# Patient Record
Sex: Female | Born: 1985 | Hispanic: Yes | Marital: Married | Smoking: Never smoker
Health system: Southern US, Community
[De-identification: ages and names within clinical notes are randomized; demographics above are authoritative.]

## PROBLEM LIST (undated history)

## (undated) DIAGNOSIS — E119 Type 2 diabetes mellitus without complications: Secondary | ICD-10-CM

---

## 2005-03-29 ENCOUNTER — Emergency Department: Payer: Self-pay | Admitting: Emergency Medicine

## 2014-07-21 ENCOUNTER — Emergency Department: Payer: Self-pay | Admitting: Emergency Medicine

## 2014-07-21 LAB — URINALYSIS, COMPLETE
Bilirubin,UR: NEGATIVE
GLUCOSE, UR: NEGATIVE mg/dL (ref 0–75)
KETONE: NEGATIVE
LEUKOCYTE ESTERASE: NEGATIVE
Nitrite: NEGATIVE
Ph: 6 (ref 4.5–8.0)
Protein: NEGATIVE
RBC,UR: 1 /HPF (ref 0–5)
Specific Gravity: 1.009 (ref 1.003–1.030)
Squamous Epithelial: 3

## 2014-07-21 LAB — CBC WITH DIFFERENTIAL/PLATELET
BASOS PCT: 0.5 %
Basophil #: 0 10*3/uL (ref 0.0–0.1)
Eosinophil #: 0.1 10*3/uL (ref 0.0–0.7)
Eosinophil %: 1.8 %
HCT: 37.7 % (ref 35.0–47.0)
HGB: 12.2 g/dL (ref 12.0–16.0)
Lymphocyte #: 1.6 10*3/uL (ref 1.0–3.6)
Lymphocyte %: 22.5 %
MCH: 27.1 pg (ref 26.0–34.0)
MCHC: 32.4 g/dL (ref 32.0–36.0)
MCV: 84 fL (ref 80–100)
MONO ABS: 0.3 x10 3/mm (ref 0.2–0.9)
Monocyte %: 3.8 %
Neutrophil #: 5.1 10*3/uL (ref 1.4–6.5)
Neutrophil %: 71.4 %
Platelet: 241 10*3/uL (ref 150–440)
RBC: 4.51 10*6/uL (ref 3.80–5.20)
RDW: 13.3 % (ref 11.5–14.5)
WBC: 7.2 10*3/uL (ref 3.6–11.0)

## 2014-07-21 LAB — BASIC METABOLIC PANEL
Anion Gap: 10 (ref 7–16)
BUN: 7 mg/dL (ref 7–18)
Calcium, Total: 8.7 mg/dL (ref 8.5–10.1)
Chloride: 106 mmol/L (ref 98–107)
Co2: 25 mmol/L (ref 21–32)
Creatinine: 0.53 mg/dL — ABNORMAL LOW (ref 0.60–1.30)
EGFR (African American): >60
EGFR (Non-African Amer.): >60
Glucose: 129 mg/dL — ABNORMAL HIGH (ref 65–99)
OSMOLALITY: 281 (ref 275–301)
Potassium: 3.5 mmol/L (ref 3.5–5.1)
Sodium: 141 mmol/L (ref 136–145)

## 2019-03-21 ENCOUNTER — Encounter: Payer: Self-pay | Admitting: Emergency Medicine

## 2019-03-21 ENCOUNTER — Emergency Department
Admission: EM | Admit: 2019-03-21 | Discharge: 2019-03-22 | Disposition: A | Discharge status: Home / Self Care | Payer: BLUE CROSS/BLUE SHIELD | Attending: Emergency Medicine | Admitting: Emergency Medicine

## 2019-03-21 DIAGNOSIS — R631 Polydipsia: Secondary | ICD-10-CM | POA: Diagnosis not present

## 2019-03-21 DIAGNOSIS — E1165 Type 2 diabetes mellitus with hyperglycemia: Secondary | ICD-10-CM | POA: Insufficient documentation

## 2019-03-21 DIAGNOSIS — R5383 Other fatigue: Secondary | ICD-10-CM | POA: Diagnosis not present

## 2019-03-21 DIAGNOSIS — R739 Hyperglycemia, unspecified: Secondary | ICD-10-CM | POA: Diagnosis present

## 2019-03-21 DIAGNOSIS — R42 Dizziness and giddiness: Secondary | ICD-10-CM | POA: Diagnosis not present

## 2019-03-21 DIAGNOSIS — E089 Diabetes mellitus due to underlying condition without complications: Secondary | ICD-10-CM

## 2019-03-21 DIAGNOSIS — R350 Frequency of micturition: Secondary | ICD-10-CM | POA: Diagnosis not present

## 2019-03-21 LAB — BASIC METABOLIC PANEL
Anion gap: 10 (ref 5–15)
BUN: 12 mg/dL (ref 6–20)
CO2: 23 mmol/L (ref 22–32)
Calcium: 9 mg/dL (ref 8.9–10.3)
Chloride: 98 mmol/L (ref 98–111)
Creatinine, Ser: 0.46 mg/dL (ref 0.44–1.00)
GFR calc Af Amer: >60 mL/min (ref >60)
GFR calc non Af Amer: >60 mL/min (ref >60)
Glucose, Bld: 488 mg/dL — ABNORMAL HIGH (ref 70–99)
Potassium: 4 mmol/L (ref 3.5–5.1)
Sodium: 131 mmol/L — ABNORMAL LOW (ref 135–145)

## 2019-03-21 LAB — URINALYSIS, COMPLETE (UACMP) WITH MICROSCOPIC
Bacteria, UA: NONE SEEN
Bilirubin Urine: NEGATIVE
Glucose, UA: >=500 mg/dL — AB
Hgb urine dipstick: NEGATIVE
Ketones, ur: 80 mg/dL — AB
Leukocytes,Ua: NEGATIVE
Nitrite: NEGATIVE
Protein, ur: NEGATIVE mg/dL
Specific Gravity, Urine: 1.03 (ref 1.005–1.030)
pH: 7 (ref 5.0–8.0)

## 2019-03-21 LAB — CBC
HCT: 38.4 % (ref 36.0–46.0)
Hemoglobin: 13.2 g/dL (ref 12.0–15.0)
MCH: 28 pg (ref 26.0–34.0)
MCHC: 34.4 g/dL (ref 30.0–36.0)
MCV: 81.5 fL (ref 80.0–100.0)
Platelets: 249 10*3/uL (ref 150–400)
RBC: 4.71 MIL/uL (ref 3.87–5.11)
RDW: 11.9 % (ref 11.5–15.5)
WBC: 8.1 10*3/uL (ref 4.0–10.5)
nRBC: 0 % (ref 0.0–0.2)

## 2019-03-21 LAB — POCT PREGNANCY, URINE: Preg Test, Ur: NEGATIVE

## 2019-03-21 LAB — GLUCOSE, CAPILLARY: Glucose-Capillary: 446 mg/dL — ABNORMAL HIGH (ref 70–99)

## 2019-03-21 MED ORDER — SODIUM CHLORIDE 0.9 % IV BOLUS
1000.0000 mL | Freq: Once | INTRAVENOUS | Status: AC
  Administered 2019-03-21: 1000 mL via INTRAVENOUS

## 2019-03-21 MED ORDER — METFORMIN HCL 500 MG PO TABS: 500.0000 mg | ORAL_TABLET | Freq: Two times a day (BID) | ORAL | 1 refills | Status: AC

## 2019-03-21 MED ORDER — SODIUM CHLORIDE 0.9 % IV BOLUS
1000.0000 mL | Freq: Once | INTRAVENOUS | Status: AC
  Administered 2019-03-21: 22:00:00 1000 mL via INTRAVENOUS

## 2019-03-21 NOTE — ED Provider Notes (Signed)
[facility] Emergency Department Provider Note    ____________________________________________   I have reviewed the triage vital signs and the nursing notes.   HISTORY  Chief Complaint Hyperglycemia   History limited by: Not Limited   HPI Jordan Maddox is a 33 y.o. female who presents to the emergency department today because of concerns for hyperglycemia.  Patient was found to have hyperglycemia when she went to urgent care today.  She states she went there because over the past couple of weeks she has noticed increased thirst and urination.  She has had associated fatigue.  She is also had some dizziness.  The patient denies any history of diabetes but states her grandparents had diabetes.   Records reviewed. Per medical record review patient has a history of plantar fasciitis  History reviewed. No pertinent past medical history.  There are no active problems to display for this patient.   History reviewed. No pertinent surgical history.  Prior to Admission medications   Not on File    Allergies Patient has no known allergies.  No family history on file.  Social History Social History   Tobacco Use  . Smoking status: Never Smoker  . Smokeless tobacco: Never Used  Substance Use Topics  . Alcohol use: Never    Frequency: Never  . Drug use: Not on file    Review of Systems Constitutional: No fever/chills Eyes: No visual changes. ENT: No sore throat. Cardiovascular: Denies chest pain. Respiratory: Denies shortness of breath. Gastrointestinal: No abdominal pain.  No nausea, no vomiting.  No diarrhea.   Genitourinary: Positive for increased urination. Musculoskeletal: Negative for back pain. Skin: Negative for rash. Neurological: Positive for dizziness. ____________________________________________   PHYSICAL EXAM:  VITAL SIGNS: ED Triage Vitals  Enc Vitals Group     BP 03/21/19 2031 134/87     Pulse Rate 03/21/19 2031  87     Resp 03/21/19 2031 18     Temp 03/21/19 2031 98.7 F (37.1 C)     Temp Source 03/21/19 2031 Oral     SpO2 03/21/19 2031 98 %     Weight 03/21/19 2035 140 lb (63.5 kg)     Height 03/21/19 2035 5\' 1"  (1.549 m)     Head Circumference --      Peak Flow --      Pain Score 03/21/19 2035 0   Constitutional: Alert and oriented.  Eyes: Conjunctivae are normal.  ENT      Head: Normocephalic and atraumatic.      Nose: No congestion/rhinnorhea.      Mouth/Throat: Mucous membranes are moist.      Neck: No stridor. Hematological/Lymphatic/Immunilogical: No cervical lymphadenopathy. Cardiovascular: Normal rate, regular rhythm.  No murmurs, rubs, or gallops.  Respiratory: Normal respiratory effort without tachypnea nor retractions. Breath sounds are clear and equal bilaterally. No wheezes/rales/rhonchi. Gastrointestinal: Soft and non tender. No rebound. No guarding.  Genitourinary: Deferred Musculoskeletal: Normal range of motion in all extremities. No lower extremity edema. Neurologic:  Normal speech and language. No gross focal neurologic deficits are appreciated.  Skin:  Skin is warm, dry and intact. No rash noted. Psychiatric: Mood and affect are normal. Speech and behavior are normal. Patient exhibits appropriate insight and judgment.  ____________________________________________    LABS (pertinent positives/negatives)  BMP wnl except na 131, glu 488 Upreg neg UA >500 glucose CBC wbc 8.1, hgb 13.2, plt 249  ____________________________________________   EKG  None  ____________________________________________    RADIOLOGY  None   ____________________________________________  PROCEDURES  Procedures  ____________________________________________   INITIAL IMPRESSION / ASSESSMENT AND PLAN / ED COURSE  Pertinent labs & imaging results that were available during my care of the patient were reviewed by me and considered in my medical decision making (see chart  for details).   Patient presented to the emergency department from urgent care today because of concern for elevated blood sugar level. Patient has complaints of polydipsia and polyuria. Blood sugar elevated here, no anion gap to suggest DKA. Discussed with patient new diagnosis of diabetes. Will plan on starting metformin. Will give IVFs.   ____________________________________________   FINAL CLINICAL IMPRESSION(S) / ED DIAGNOSES  Final diagnoses:  Hyperglycemia  Diabetes mellitus due to underlying condition without complication, without long-term current use of insulin (HCC)     Note: This dictation was prepared with Dragon dictation. Any transcriptional errors that result from this process are unintentional     Phineas Semen, MD 03/22/19 1551

## 2019-03-21 NOTE — ED Triage Notes (Signed)
Patient c/o intermittent dizziness, weakness, increased urination, increased thirst X 2 weeks. Patient seen at [facility] and CBG > 400.

## 2019-03-21 NOTE — ED Notes (Signed)
NS liter bags were hung higher on a IV pole to infuse quicker. Patient appears comfortable, drowsy. NAD.

## 2019-03-21 NOTE — Discharge Instructions (Signed)
Please seek medical attention for any high fevers, chest pain, shortness of breath, change in behavior, persistent vomiting, bloody stool or any other new or concerning symptoms.  

## 2019-03-22 LAB — GLUCOSE, CAPILLARY
Glucose-Capillary: 275 mg/dL — ABNORMAL HIGH (ref 70–99)
Glucose-Capillary: 376 mg/dL — ABNORMAL HIGH (ref 70–99)

## 2019-04-22 ENCOUNTER — Ambulatory Visit: Payer: BLUE CROSS/BLUE SHIELD | Admitting: Dietician

## 2021-06-05 ENCOUNTER — Other Ambulatory Visit: Payer: Self-pay

## 2021-06-05 ENCOUNTER — Emergency Department
Admission: EM | Admit: 2021-06-05 | Discharge: 2021-06-05 | Disposition: A | Discharge status: Home / Self Care | Payer: BC Managed Care – PPO | Attending: Emergency Medicine | Admitting: Emergency Medicine

## 2021-06-05 ENCOUNTER — Emergency Department: Payer: BC Managed Care – PPO

## 2021-06-05 DIAGNOSIS — E119 Type 2 diabetes mellitus without complications: Secondary | ICD-10-CM | POA: Diagnosis not present

## 2021-06-05 DIAGNOSIS — R079 Chest pain, unspecified: Secondary | ICD-10-CM | POA: Insufficient documentation

## 2021-06-05 LAB — BASIC METABOLIC PANEL
Anion gap: 9 (ref 5–15)
BUN: 11 mg/dL (ref 6–20)
CO2: 25 mmol/L (ref 22–32)
Calcium: 9 mg/dL (ref 8.9–10.3)
Chloride: 105 mmol/L (ref 98–111)
Creatinine, Ser: 0.52 mg/dL (ref 0.44–1.00)
GFR, Estimated: >60 mL/min (ref >60)
Glucose, Bld: 140 mg/dL — ABNORMAL HIGH (ref 70–99)
Potassium: 3.7 mmol/L (ref 3.5–5.1)
Sodium: 139 mmol/L (ref 135–145)

## 2021-06-05 LAB — CBC
HCT: 39.4 % (ref 36.0–46.0)
Hemoglobin: 13.7 g/dL (ref 12.0–15.0)
MCH: 28.7 pg (ref 26.0–34.0)
MCHC: 34.8 g/dL (ref 30.0–36.0)
MCV: 82.6 fL (ref 80.0–100.0)
Platelets: 252 10*3/uL (ref 150–400)
RBC: 4.77 MIL/uL (ref 3.87–5.11)
RDW: 12.6 % (ref 11.5–15.5)
WBC: 5.9 10*3/uL (ref 4.0–10.5)
nRBC: 0 % (ref 0.0–0.2)

## 2021-06-05 LAB — TROPONIN I (HIGH SENSITIVITY): Troponin I (High Sensitivity): <2 ng/L (ref <18)

## 2021-06-05 NOTE — ED Triage Notes (Signed)
Pt comes pov with cp off and on for 2 weeks. Was seen at [facility] yesterday and told to come here. States sometimes her left arm hurts with the chest pain.

## 2021-06-05 NOTE — ED Provider Notes (Signed)
[facility]  ____________________________________________   Event Date/Time   First MD Initiated Contact with Patient 06/05/21 1159     (approximate)  I have reviewed the triage vital signs and the nursing notes.   HISTORY  Chief Complaint Chest Pain    HPI Menaal Ella Golomb is a 35 y.o. female DM on metformin who presents with chest pain.  Symptom onset was about 2 weeks ago.  Pain is located in the center of her chest, occasionally radiates to the left arm.  It is not worse with exertion or with breathing.  Has been fairly constant.  She denies associated cough, fevers or chills.  She denies associated nausea vomiting dizziness or lightheadedness.  Denies lower extremity swelling or pain. The patient denies hx of prior DVT/PE, unilateral leg pain/swelling, hormone use (other than IUD), recent surgery, hx of cancer, prolonged immobilization, or hemoptysis.      Pmh  DM    History reviewed. No pertinent surgical history.  Prior to Admission medications   Medication Sig Start Date End Date Taking? Authorizing Provider  metFORMIN (GLUCOPHAGE) 500 MG tablet Take 1 tablet (500 mg total) by mouth 2 (two) times daily with a meal. 03/21/19 03/20/20  Phineas Semen, MD    Allergies Patient has no known allergies.  History reviewed. No pertinent family history.  Social History Social History   Tobacco Use   Smoking status: Never   Smokeless tobacco: Never  Substance Use Topics   Alcohol use: Never    Review of Systems   Review of Systems  Constitutional:  Negative for chills and fever.  Respiratory:  Negative for cough and shortness of breath.   Cardiovascular:  Positive for chest pain. Negative for leg swelling.  Gastrointestinal:  Negative for abdominal pain, nausea and vomiting.  Neurological:  Negative for dizziness and light-headedness.  All other systems reviewed and are negative.  Physical Exam Updated Vital Signs BP (!) 132/100    Pulse 67   Temp 98.5 F (36.9 C) (Oral)   Resp 18   Ht 5\' 1"  (1.549 m)   Wt 68 kg   LMP 06/03/2021   SpO2 99%   BMI 28.34 kg/m   Physical Exam Vitals and nursing note reviewed.  Constitutional:      General: She is not in acute distress.    Appearance: Normal appearance.  HENT:     Head: Normocephalic and atraumatic.  Eyes:     General: No scleral icterus.    Conjunctiva/sclera: Conjunctivae normal.  Cardiovascular:     Heart sounds: Normal heart sounds.  Pulmonary:     Effort: Pulmonary effort is normal. No respiratory distress.     Breath sounds: No stridor.  Abdominal:     Palpations: Abdomen is soft.     Tenderness: There is no abdominal tenderness. There is no guarding.  Musculoskeletal:        General: No deformity or signs of injury.     Cervical back: Normal range of motion.     Right lower leg: No edema.     Left lower leg: No edema.  Skin:    General: Skin is dry.     Coloration: Skin is not jaundiced or pale.  Neurological:     General: No focal deficit present.     Mental Status: She is alert and oriented to person, place, and time. Mental status is at baseline.  Psychiatric:        Mood and Affect: Mood normal.  Behavior: Behavior normal.     LABS (all labs ordered are listed, but only abnormal results are displayed)  Labs Reviewed  BASIC METABOLIC PANEL - Abnormal; Notable for the following components:      Result Value   Glucose, Bld 140 (*)    All other components within normal limits  CBC  POC URINE PREG, ED  TROPONIN I (HIGH SENSITIVITY)   ____________________________________________  EKG  NSR, nml axis, nml intervals, no ST or T waves changes, no ischemia  ____________________________________________  RADIOLOGY I, Randol Kern, personally viewed and evaluated these images (plain radiographs) as part of my medical decision making, as well as reviewing the written report by the radiologist.  ED MD interpretation: I  reviewed the chest x-ray which does not show any acute cardiopulmonary process   ____________________________________________    PROCEDURES  Procedure(s) performed (including Critical Care):  Procedures   ____________________________________________   INITIAL IMPRESSION / ASSESSMENT AND PLAN / ED COURSE     The patient is a 35 year old female presenting with chest pain x2 weeks.  There are really no associated symptoms.  Her chest x-ray does not show any pneumonia or other acute cardiopulmonary process.  Her EKG is nonischemic.  She is PERC negative, making PE exceedingly unlikely.  Symptoms are not consistent with ACS either and with her troponin being undetectable with this going on for 2 weeks I have very low suspicion.  No indication for further work-up at this time.  I discussed follow-up with her PCP if she continues to have the symptoms.   ____________________________________________   FINAL CLINICAL IMPRESSION(S) / ED DIAGNOSES  Final diagnoses:  Chest pain, unspecified type     ED Discharge Orders     None        Note:  This document was prepared using Dragon voice recognition software and may include unintentional dictation errors.    Georga Hacking, MD 06/05/21 1240

## 2021-06-05 NOTE — Discharge Instructions (Addendum)
Your chest x-ray, EKG and blood work were very reassuring today.  If you continue to have pain in your chest, please follow-up with your primary care physician.

## 2022-02-07 ENCOUNTER — Emergency Department
Admission: EM | Admit: 2022-02-07 | Discharge: 2022-02-07 | Disposition: A | Discharge status: Home / Self Care | Payer: 59 | Attending: Emergency Medicine | Admitting: Emergency Medicine

## 2022-02-07 ENCOUNTER — Other Ambulatory Visit: Payer: Self-pay

## 2022-02-07 ENCOUNTER — Encounter: Payer: Self-pay | Admitting: Emergency Medicine

## 2022-02-07 DIAGNOSIS — E1165 Type 2 diabetes mellitus with hyperglycemia: Secondary | ICD-10-CM | POA: Insufficient documentation

## 2022-02-07 DIAGNOSIS — R739 Hyperglycemia, unspecified: Secondary | ICD-10-CM

## 2022-02-07 DIAGNOSIS — R42 Dizziness and giddiness: Secondary | ICD-10-CM | POA: Diagnosis present

## 2022-02-07 HISTORY — DX: Type 2 diabetes mellitus without complications: E11.9

## 2022-02-07 LAB — CBG MONITORING, ED: Glucose-Capillary: 324 mg/dL — ABNORMAL HIGH (ref 70–99)

## 2022-02-07 NOTE — ED Triage Notes (Signed)
Pt with cbg 324 here. NAD. See first nurse note. ?

## 2022-02-07 NOTE — ED Provider Notes (Signed)
? ?  [facility] ?Provider Note ? ? ? Event Date/Time  ? First MD Initiated Contact with Patient 02/07/22 1407   ?  (approximate) ? ? ?History  ? ?Hyperglycemia ? ? ?HPI ? ?Jordan Maddox is a 36 y.o. female with a history of diabetes who takes metformin who presents with complaints of elevated glucose.  Patient reports at work today she was feeling a little bit dizzy and told her boss, when she also determined that her glucose was elevated he sent her to the emergency department.  She reports she is feeling much better now.  No nausea or vomiting.  Reports compliance with her medication, usually keeps her glucose well controlled.  Takes metformin every morning ?  ? ? ?Physical Exam  ? ?Triage Vital Signs: ?ED Triage Vitals  ?Enc Vitals Group  ?   BP 02/07/22 1344 136/89  ?   Pulse Rate 02/07/22 1344 82  ?   Resp 02/07/22 1344 16  ?   Temp 02/07/22 1344 98.1 ?F (36.7 ?C)  ?   Temp Source 02/07/22 1344 Oral  ?   SpO2 02/07/22 1344 93 %  ?   Weight 02/07/22 1347 62.6 kg (138 lb)  ?   Height 02/07/22 1347 1.549 m (5\' 1" )  ?   Head Circumference --   ?   Peak Flow --   ?   Pain Score 02/07/22 1347 0  ?   Pain Loc --   ?   Pain Edu? --   ?   Excl. in [place]? --   ? ? ?Most recent vital signs: ?Vitals:  ? 02/07/22 1344  ?BP: 136/89  ?Pulse: 82  ?Resp: 16  ?Temp: 98.1 ?F (36.7 ?C)  ?SpO2: 93%  ? ? ? ?General: Awake, no distress.  ?CV:  Good peripheral perfusion.  ?Resp:  Normal effort.  ?Abd:  No distention. Other:   ? ? ?ED Results / Procedures / Treatments  ? ?Labs ?(all labs ordered are listed, but only abnormal results are displayed) ?Labs Reviewed  ?CBG MONITORING, ED - Abnormal; Notable for the following components:  ?    Result Value  ? Glucose-Capillary 324 (*)   ? All other components within normal limits  ? ? ? ?EKG ? ? ? ? ?RADIOLOGY ? ? ? ? ?PROCEDURES: ? ?Critical Care performed:  ?Procedures ? ? ?MEDICATIONS ORDERED IN ED: ?Medications - No data to display ? ? ?IMPRESSION / MDM /  ASSESSMENT AND PLAN / ED COURSE  ?I reviewed the triage vital signs and the nursing notes. ? ?Patient's presentation is not consistent with DKA, glucose has improved significantly since earlier level.  She is well-appearing with no complaints.  No nausea or vomiting.  Normal heart rate.  Normal exam. ? ?Appropriate for discharge, recommend increasing metformin to twice daily as needed, return precautions discussed ? ? ? ? ? ?  ? ? ?FINAL CLINICAL IMPRESSION(S) / ED DIAGNOSES  ? ?Final diagnoses:  ?Hyperglycemia  ? ? ? ?Rx / DC Orders  ? ?ED Discharge Orders   ? ? None  ? ?  ? ? ? ?Note:  This document was prepared using Dragon voice recognition software and may include unintentional dictation errors. ?  ?02/09/22, MD ?02/07/22 1558 ? ?

## 2022-02-07 NOTE — ED Triage Notes (Signed)
FIRST NURSE NOTE:  ?Pt from [facility]. Per staff, pt CBG was over 400 this AM. Pt is on metformin, they were sent over for possible DKA. Pt is A&OX4 and NAD ?

## 2022-11-01 ENCOUNTER — Other Ambulatory Visit: Payer: Self-pay

## 2022-11-01 ENCOUNTER — Emergency Department
Admission: EM | Admit: 2022-11-01 | Discharge: 2022-11-01 | Disposition: A | Discharge status: Home / Self Care | Payer: 59 | Attending: Emergency Medicine | Admitting: Emergency Medicine

## 2022-11-01 DIAGNOSIS — J101 Influenza due to other identified influenza virus with other respiratory manifestations: Secondary | ICD-10-CM | POA: Insufficient documentation

## 2022-11-01 DIAGNOSIS — R0981 Nasal congestion: Secondary | ICD-10-CM | POA: Diagnosis present

## 2022-11-01 DIAGNOSIS — Z1152 Encounter for screening for COVID-19: Secondary | ICD-10-CM | POA: Diagnosis not present

## 2022-11-01 DIAGNOSIS — E119 Type 2 diabetes mellitus without complications: Secondary | ICD-10-CM | POA: Diagnosis not present

## 2022-11-01 LAB — RESP PANEL BY RT-PCR (RSV, FLU A&B, COVID)  RVPGX2
Influenza A by PCR: NEGATIVE
Influenza B by PCR: POSITIVE — AB
Resp Syncytial Virus by PCR: NEGATIVE
SARS Coronavirus 2 by RT PCR: NEGATIVE

## 2022-11-01 MED ORDER — ONDANSETRON 4 MG PO TBDP
4.0000 mg | ORAL_TABLET | Freq: Once | ORAL | Status: AC
  Administered 2022-11-01: 4 mg via ORAL
  Filled 2022-11-01: qty 1

## 2022-11-01 NOTE — ED Provider Notes (Signed)
[facility] Provider Note    Event Date/Time   First MD Initiated Contact with Patient 11/01/22 1136     (approximate)   History   Nasal Congestion and Emesis   HPI  Rylie Limburg is a 37 y.o. female history of diabetes who presents with cough congestion runny nose nausea vomiting diarrhea.  Patient symptoms started about 4 days ago.  She has had nonproductive cough with mild dyspnea subjective fevers chills headache body aches.  Yesterday had vomiting diarrhea is tolerating liquids today but has poor appetite.  No sick contacts.  Denies chest pain or belly pain.  Does not think she could be pregnant she is on her menstrual period currently.    Past Medical History:  Diagnosis Date   Diabetes mellitus without complication ([place])     There are no problems to display for this patient.    Physical Exam  Triage Vital Signs: ED Triage Vitals  Enc Vitals Group     BP 11/01/22 1118 126/81     Pulse Rate 11/01/22 1118 (!) 108     Resp 11/01/22 1118 17     Temp 11/01/22 1118 99 F (37.2 C)     Temp Source 11/01/22 1118 Oral     SpO2 11/01/22 1118 98 %     Weight 11/01/22 1118 127 lb (57.6 kg)     Height 11/01/22 1118 5\' 1"  (1.549 m)     Head Circumference --      Peak Flow --      Pain Score 11/01/22 1122 0     Pain Loc --      Pain Edu? --      Excl. in [place]? --     Most recent vital signs: Vitals:   11/01/22 1118  BP: 126/81  Pulse: (!) 108  Resp: 17  Temp: 99 F (37.2 C)  SpO2: 98%     General: Awake, no distress.  CV:  Good peripheral perfusion.  Resp:  Normal effort.  No increased work of breathing lung sounds are clear Abd:  No distention.  Neuro:             Awake, Alert, Oriented x 3  Other:     ED Results / Procedures / Treatments  Labs (all labs ordered are listed, but only abnormal results are displayed) Labs Reviewed  RESP PANEL BY RT-PCR (RSV, FLU A&B, COVID)  RVPGX2 - Abnormal; Notable for the following  components:      Result Value   Influenza B by PCR POSITIVE (*)    All other components within normal limits     EKG    RADIOLOGY    PROCEDURES:  Critical Care performed: No  Procedures {  MEDICATIONS ORDERED IN ED: Medications  ondansetron (ZOFRAN-ODT) disintegrating tablet 4 mg (has no administration in time range)     IMPRESSION / MDM / ASSESSMENT AND PLAN / ED COURSE  I reviewed the triage vital signs and the nursing notes.                              Patient's presentation is most consistent with acute, uncomplicated illness.  Differential diagnosis includes, but is not limited to, viral illness including influenza, COVID, RSV, pneumonia, gastroenteritis  Patient is a 37 year old female past medical history of diabetes who presents because of flulike symptoms for the last 4 days.  She is mildly tachycardic on arrival.  Complains of  some mild dyspnea but lungs are clear she is satting 98% on room air.  Has additional viral symptoms of cough headache body aches vomiting diarrhea but is tolerating liquids today without abdominal pain.  She is notably flu positive for influenza B.  Discussed supportive measures.  She is outside the window for Tamiflu.  Patient given dose of ODT Zofran in the ED.  Work note provided.       FINAL CLINICAL IMPRESSION(S) / ED DIAGNOSES   Final diagnoses:  Influenza B     Rx / DC Orders   ED Discharge Orders     None        Note:  This document was prepared using Dragon voice recognition software and may include unintentional dictation errors.   Georga Hacking, MD 11/01/22 1226

## 2022-11-01 NOTE — ED Provider Triage Note (Signed)
Emergency Medicine Provider Triage Evaluation Note  Jordan Maddox , a 37 y.o. female  was evaluated in triage.  Pt complains of cough, congestion and flulike symptoms x 3 days.  No known exposure to COVID.  Review of Systems  Positive: Cough. Negative: Negative for vomiting and diarrhea.  Physical Exam  BP 126/81 (BP Location: Left Arm)   Pulse (!) 108   Temp 99 F (37.2 C) (Oral)   Resp 17   Ht 5\' 1"  (1.549 m)   Wt 57.6 kg   SpO2 98%   BMI 24.00 kg/m  Gen:   Awake, no distress   Resp:  Normal effort  MSK:   Moves extremities without difficulty  Other:    Medical Decision Making  Medically screening exam initiated at 11:20 AM.  Appropriate orders placed.  Ova Freshwater Sharen Counter was informed that the remainder of the evaluation will be completed by another provider, this initial triage assessment does not replace that evaluation, and the importance of remaining in the ED until their evaluation is complete.     Johnn Hai, PA-C 11/01/22 1123

## 2022-11-01 NOTE — ED Triage Notes (Signed)
Pt presents to ED with c/o of congestion and flu like s/s since Saturday. NAD noted.

## 2023-03-18 IMAGING — CR DG CHEST 2V
1 series · 2 of 2 positions shown · non-contrast
Comparison: none

CLINICAL DATA: Pt comes pov with cp off and on for 2 weeks. Was
seen [REDACTED] yesterday and told to come here. States sometimes her
left arm hurts with the chest pain, smoker

EXAM:
CHEST - 2 VIEW

[Series 1: dg chest 2 view · 0.14mm/px · 2 of 2 slices shown]
[im 1/2]
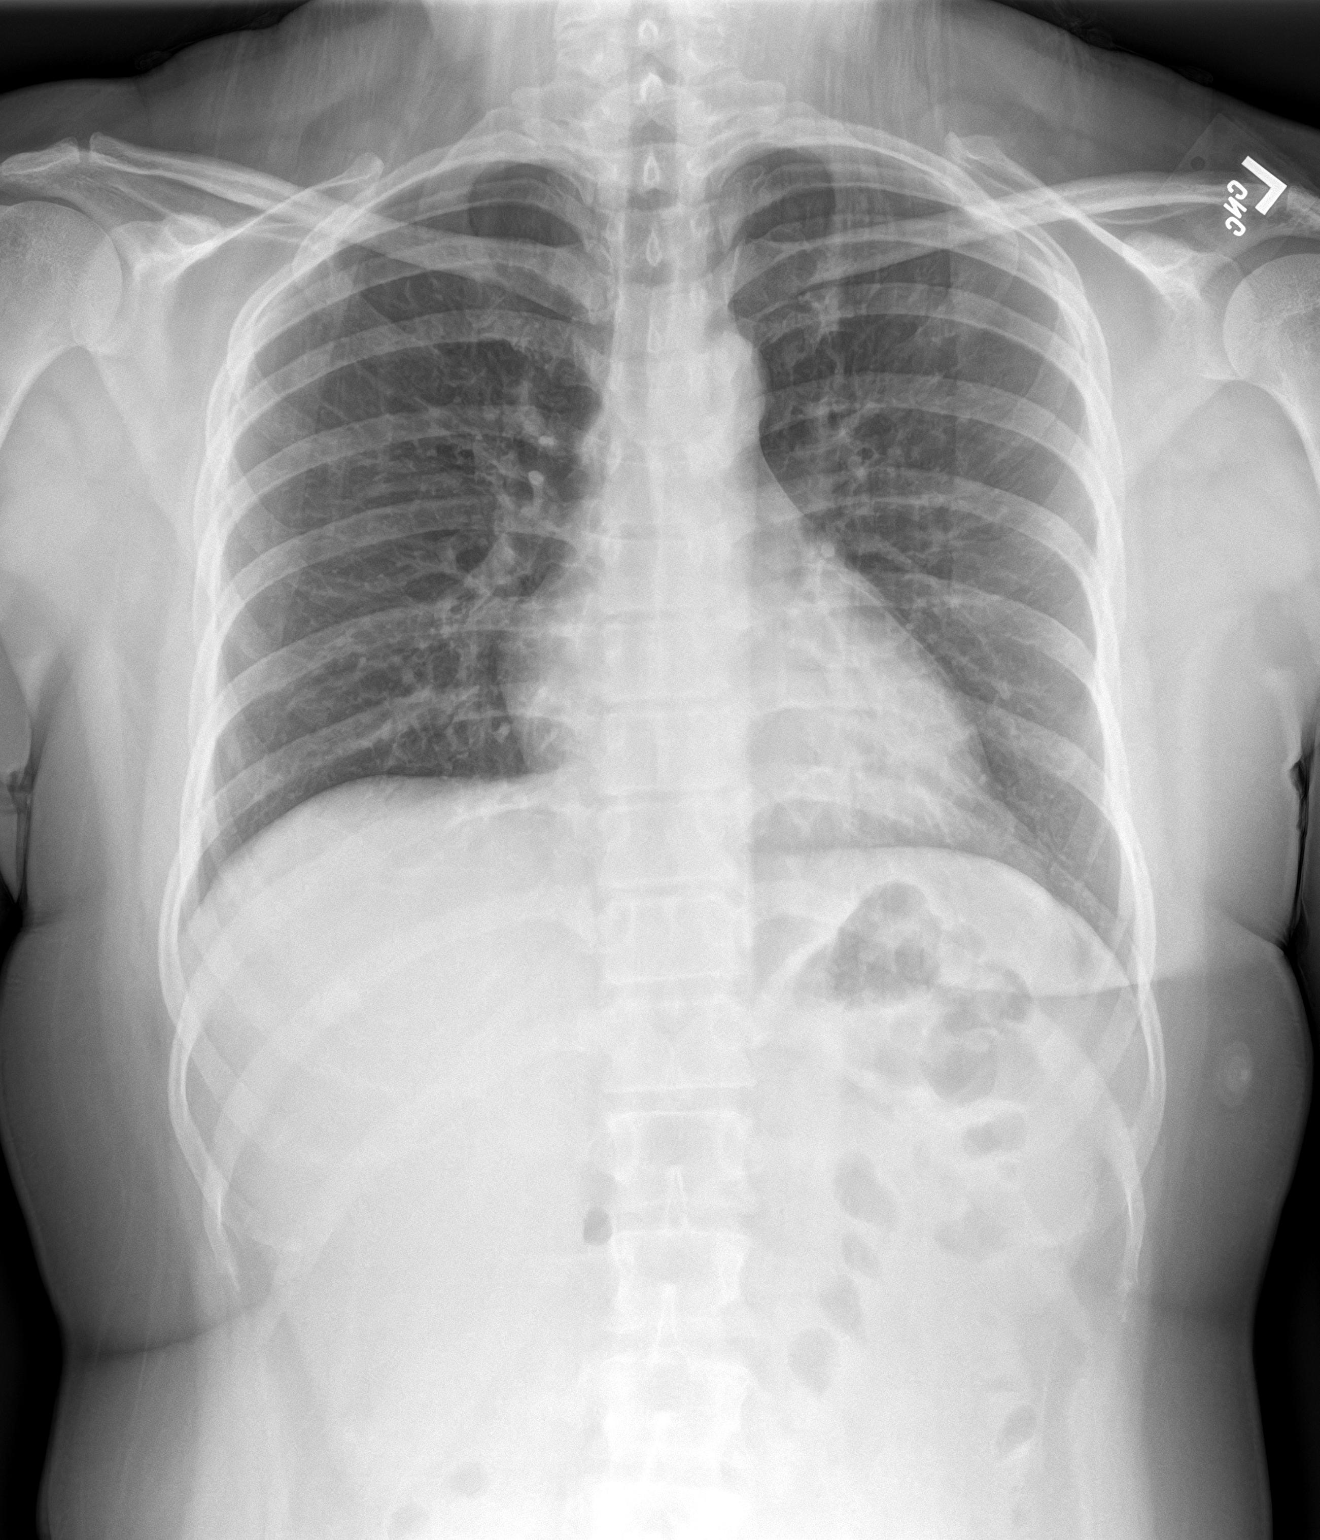
[im 2/2]
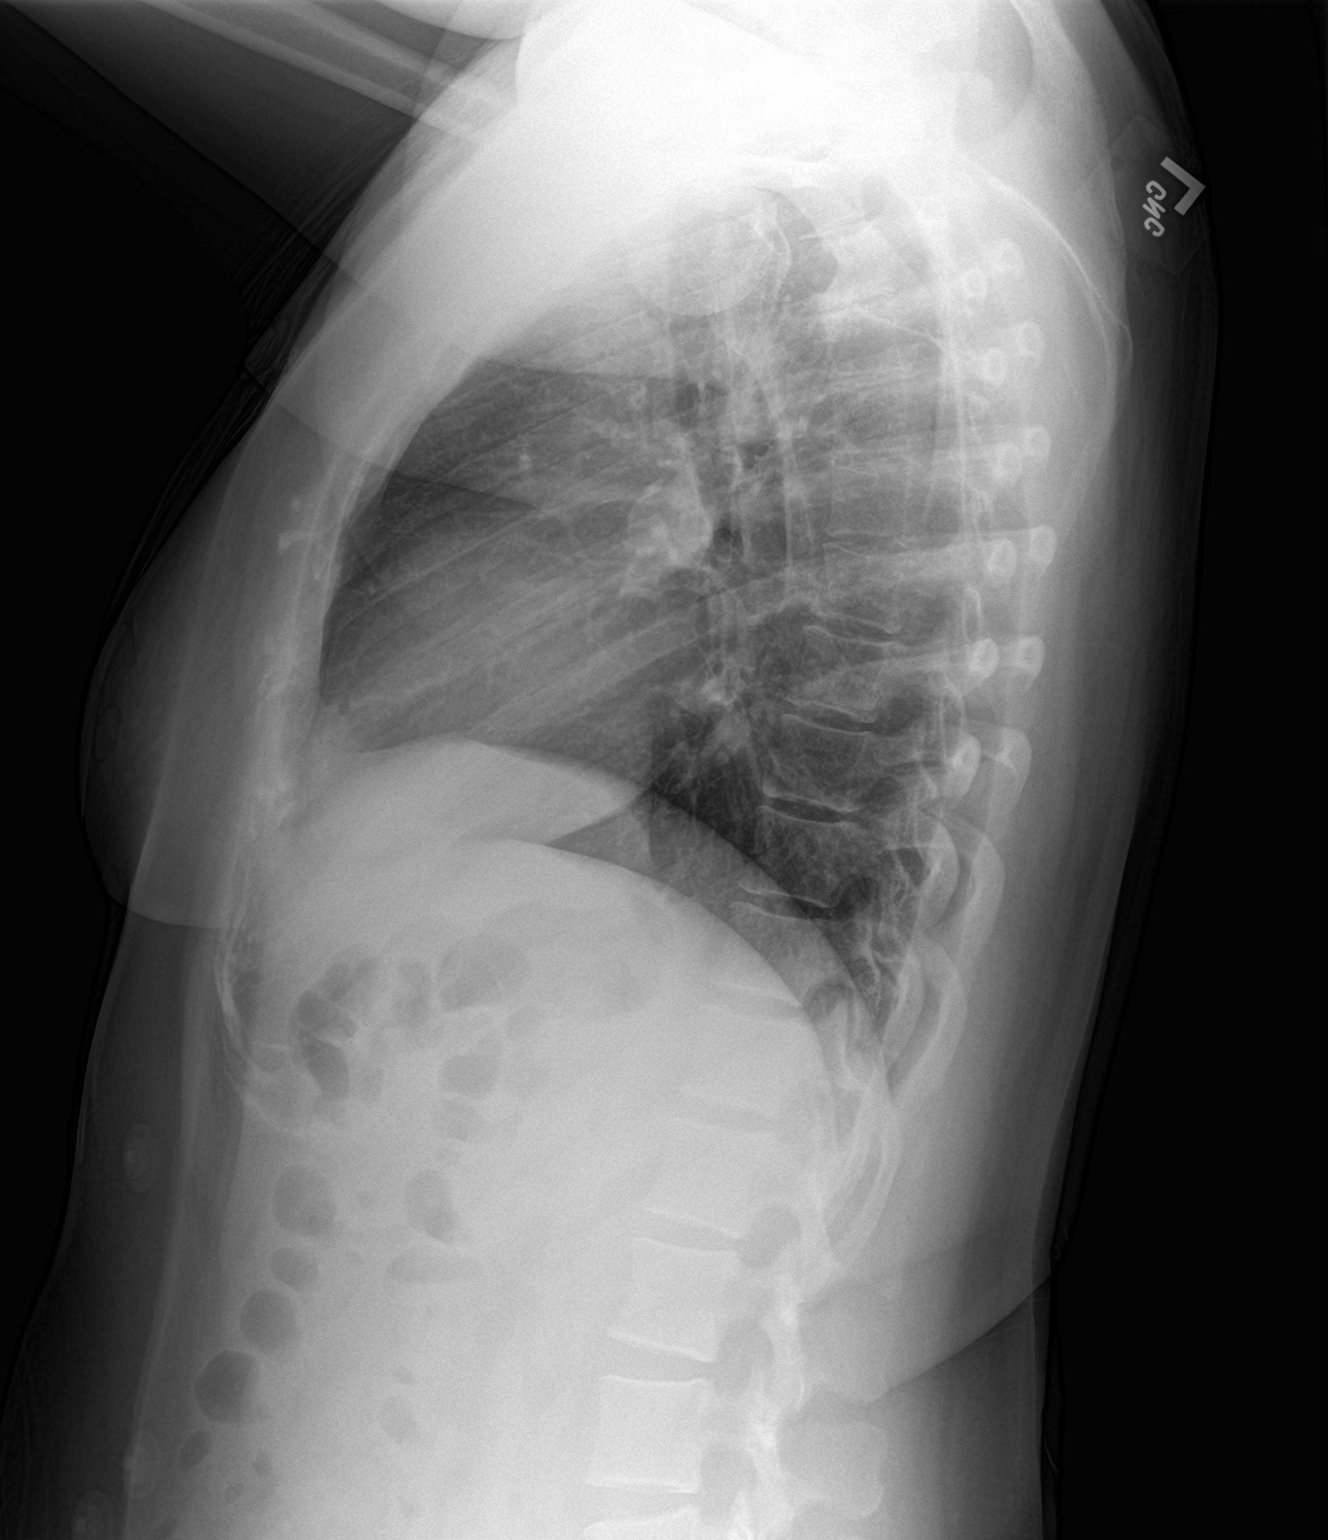

[2 of 2 positions shown; findings below may reference images not displayed]

FINDINGS: Lungs are clear.

Heart size and mediastinal contours are within normal limits.

No effusion.  No pneumothorax.

Visualized bones unremarkable.
IMPRESSION: No acute cardiopulmonary disease.

## 2023-11-22 ENCOUNTER — Other Ambulatory Visit: Payer: Self-pay | Admitting: Certified Nurse Midwife

## 2023-11-22 DIAGNOSIS — Z1231 Encounter for screening mammogram for malignant neoplasm of breast: Secondary | ICD-10-CM

## 2024-03-18 ENCOUNTER — Other Ambulatory Visit: Payer: Self-pay

## 2024-03-18 ENCOUNTER — Emergency Department
Admission: EM | Admit: 2024-03-18 | Discharge: 2024-03-18 | Disposition: A | Discharge status: Home / Self Care | Attending: Emergency Medicine | Admitting: Emergency Medicine

## 2024-03-18 ENCOUNTER — Emergency Department

## 2024-03-18 DIAGNOSIS — D219 Benign neoplasm of connective and other soft tissue, unspecified: Secondary | ICD-10-CM

## 2024-03-18 DIAGNOSIS — E119 Type 2 diabetes mellitus without complications: Secondary | ICD-10-CM | POA: Diagnosis not present

## 2024-03-18 DIAGNOSIS — D251 Intramural leiomyoma of uterus: Secondary | ICD-10-CM | POA: Diagnosis not present

## 2024-03-18 DIAGNOSIS — Z7984 Long term (current) use of oral hypoglycemic drugs: Secondary | ICD-10-CM | POA: Diagnosis not present

## 2024-03-18 DIAGNOSIS — R1030 Lower abdominal pain, unspecified: Secondary | ICD-10-CM | POA: Diagnosis present

## 2024-03-18 LAB — URINALYSIS, ROUTINE W REFLEX MICROSCOPIC
Bacteria, UA: NONE SEEN
Bilirubin Urine: NEGATIVE
Glucose, UA: >=500 mg/dL — AB
Hgb urine dipstick: NEGATIVE
Ketones, ur: NEGATIVE mg/dL
Nitrite: NEGATIVE
Protein, ur: NEGATIVE mg/dL
Specific Gravity, Urine: 1.028 (ref 1.005–1.030)
pH: 6 (ref 5.0–8.0)

## 2024-03-18 LAB — COMPREHENSIVE METABOLIC PANEL WITH GFR
ALT: 14 U/L (ref 0–44)
AST: 15 U/L (ref 15–41)
Albumin: 3.8 g/dL (ref 3.5–5.0)
Alkaline Phosphatase: 84 U/L (ref 38–126)
Anion gap: 9 (ref 5–15)
BUN: 15 mg/dL (ref 6–20)
CO2: 22 mmol/L (ref 22–32)
Calcium: 8.2 mg/dL — ABNORMAL LOW (ref 8.9–10.3)
Chloride: 104 mmol/L (ref 98–111)
Creatinine, Ser: 0.52 mg/dL (ref 0.44–1.00)
GFR, Estimated: >60 mL/min (ref >60)
Glucose, Bld: 359 mg/dL — ABNORMAL HIGH (ref 70–99)
Potassium: 3.7 mmol/L (ref 3.5–5.1)
Sodium: 135 mmol/L (ref 135–145)
Total Bilirubin: 0.4 mg/dL (ref 0.0–1.2)
Total Protein: 6.8 g/dL (ref 6.5–8.1)

## 2024-03-18 LAB — BETA-HYDROXYBUTYRIC ACID: Beta-Hydroxybutyric Acid: 0.18 mmol/L (ref 0.05–0.27)

## 2024-03-18 LAB — CBC
HCT: 40.8 % (ref 36.0–46.0)
Hemoglobin: 13.7 g/dL (ref 12.0–15.0)
MCH: 27.4 pg (ref 26.0–34.0)
MCHC: 33.6 g/dL (ref 30.0–36.0)
MCV: 81.6 fL (ref 80.0–100.0)
Platelets: 284 10*3/uL (ref 150–400)
RBC: 5 MIL/uL (ref 3.87–5.11)
RDW: 12.4 % (ref 11.5–15.5)
WBC: 7.9 10*3/uL (ref 4.0–10.5)
nRBC: 0 % (ref 0.0–0.2)

## 2024-03-18 LAB — CBG MONITORING, ED: Glucose-Capillary: 291 mg/dL — ABNORMAL HIGH (ref 70–99)

## 2024-03-18 LAB — LIPASE, BLOOD: Lipase: 34 U/L (ref 11–51)

## 2024-03-18 LAB — POC URINE PREG, ED: Preg Test, Ur: NEGATIVE

## 2024-03-18 MED ORDER — ACETAMINOPHEN 325 MG PO TABS
650.0000 mg | ORAL_TABLET | Freq: Once | ORAL | Status: AC
  Administered 2024-03-18: 650 mg via ORAL
  Filled 2024-03-18: qty 2

## 2024-03-18 MED ORDER — LIDOCAINE 5 % EX PTCH
1.0000 | MEDICATED_PATCH | CUTANEOUS | Status: DC
  Administered 2024-03-18: 1 via TRANSDERMAL
  Filled 2024-03-18: qty 1

## 2024-03-18 NOTE — ED Provider Notes (Signed)
 [facility] Provider Note    Event Date/Time   First MD Initiated Contact with Patient 03/18/24 0845     (approximate)   History   Abdominal Pain and Hyperglycemia   HPI  Jordan Maddox is a 38 y.o. female with a past medical history of type 2 diabetes on metformin  who presents today for evaluation of low back pain and an low abdominal pain that began this morning.  She denies any burning with urination.  She denies nausea or vomiting or diarrhea.  No vaginal bleeding or discharge.  No fevers or chills.   There are no active problems to display for this patient.         Physical Exam   Triage Vital Signs: ED Triage Vitals  Encounter Vitals Group     BP 03/18/24 0829 115/70     Systolic BP Percentile --      Diastolic BP Percentile --      Pulse Rate 03/18/24 0829 67     Resp 03/18/24 0829 20     Temp 03/18/24 0829 98.1 F (36.7 C)     Temp Source 03/18/24 0829 Oral     SpO2 03/18/24 0829 100 %     Weight 03/18/24 0828 115 lb (52.2 kg)     Height 03/18/24 0828 5\' 1"  (1.549 m)     Head Circumference --      Peak Flow --      Pain Score 03/18/24 0828 10     Pain Loc --      Pain Education --      Exclude from Growth Chart --     Most recent vital signs: Vitals:   03/18/24 0829  BP: 115/70  Pulse: 67  Resp: 20  Temp: 98.1 F (36.7 C)  SpO2: 100%    Physical Exam Vitals and nursing note reviewed.  Constitutional:      General: Awake and alert. No acute distress.    Appearance: Normal appearance. The patient is normal weight.  HENT:     Head: Normocephalic and atraumatic.     Mouth: Mucous membranes are moist.  Eyes:     General: PERRL. Normal EOMs        Right eye: No discharge.        Left eye: No discharge.     Conjunctiva/sclera: Conjunctivae normal.  Cardiovascular:     Rate and Rhythm: Normal rate and regular rhythm.     Pulses: Normal pulses.  Pulmonary:     Effort: Pulmonary effort is normal. No respiratory  distress.     Breath sounds: Normal breath sounds.  Abdominal:     Abdomen is soft. There is no abdominal tenderness. No rebound or guarding. No distention. No CVAT Musculoskeletal:        General: No swelling. Normal range of motion.     Cervical back: Normal range of motion and neck supple.  Skin:    General: Skin is warm and dry.     Capillary Refill: Capillary refill takes less than 2 seconds.     Findings: No rash.  Neurological:     Mental Status: The patient is awake and alert.      ED Results / Procedures / Treatments   Labs (all labs ordered are listed, but only abnormal results are displayed) Labs Reviewed  COMPREHENSIVE METABOLIC PANEL WITH GFR - Abnormal; Notable for the following components:      Result Value   Glucose, Bld 359 (*)  Calcium 8.2 (*)    All other components within normal limits  URINALYSIS, ROUTINE W REFLEX MICROSCOPIC - Abnormal; Notable for the following components:   Color, Urine STRAW (*)    APPearance CLEAR (*)    Glucose, UA >=500 (*)    Leukocytes,Ua TRACE (*)    All other components within normal limits  CBG MONITORING, ED - Abnormal; Notable for the following components:   Glucose-Capillary 291 (*)    All other components within normal limits  URINE CULTURE  LIPASE, BLOOD  CBC  BETA-HYDROXYBUTYRIC ACID  POC URINE PREG, ED     EKG     RADIOLOGY I independently reviewed and interpreted imaging and agree with radiologists findings.     PROCEDURES:  Critical Care performed:   Procedures   MEDICATIONS ORDERED IN ED: Medications  lidocaine  (LIDODERM ) 5 % 1 patch (1 patch Transdermal Patch Applied 03/18/24 0957)  acetaminophen  (TYLENOL ) tablet 650 mg (650 mg Oral Given 03/18/24 0956)     IMPRESSION / MDM / ASSESSMENT AND PLAN / ED COURSE  I reviewed the triage vital signs and the nursing notes.   Differential diagnosis includes, but is not limited to, UTI, nephrolithiasis, pyelonephritis, ovarian cyst, less likely  torsion.  Patient is awake and alert, hemodynamically stable and afebrile.  She has no reproducible abdominal tenderness on exam.  No CVA tenderness.  Labs obtained are overall reassuring, no leukocytosis.  No tenderness to deep palpation to the right lower quadrant, negative Rovsing's and psoas signs, no pain with ambulation or jumping, do not suspect appendicitis.  Urinalysis shows trace leukocytes, though no WBCs, nitrites, or bacteria, this was sent for culture as she has no urinary symptoms.  Ultrasound obtained reveals a fibroid which may be the etiology of her pain.  I do not suspect intra-abdominal infection.  She denies concern for STDs, has no vaginal discharge or bleeding and does not wish to be tested for STDs.  We discussed return precautions and outpatient follow-up.  At time of reevaluation she reports that her pain has resolved completely.  Patient understands and agrees with plan.  She was discharged in stable condition.   Patient's presentation is most consistent with acute complicated illness / injury requiring diagnostic workup.    FINAL CLINICAL IMPRESSION(S) / ED DIAGNOSES   Final diagnoses:  Fibroid     Rx / DC Orders   ED Discharge Orders     None        Note:  This document was prepared using Dragon voice recognition software and may include unintentional dictation errors.   Ouida Abeyta E, PA-C 03/18/24 1444    Viviano Ground, MD 03/18/24 [phone]

## 2024-03-18 NOTE — ED Triage Notes (Signed)
 Pt to ED for lower abdominal pain and back pain since this morning. Then began feeling nauseous. States CBG was 344, has DM type 2 and takes metformin . CBG is 291. Pt in NAD, skin dry.

## 2024-03-18 NOTE — Discharge Instructions (Signed)
 Please follow-up with your outpatient provider.  Please remember to maintain proper control of your blood sugar.  Please return for any new, worsening, or change in symptoms or other concerns.  It was a pleasure caring for you today.

## 2024-03-20 LAB — URINE CULTURE: Culture: <10000 — AB

## 2024-07-29 ENCOUNTER — Other Ambulatory Visit: Payer: Self-pay

## 2024-07-29 ENCOUNTER — Emergency Department

## 2024-07-29 DIAGNOSIS — R0789 Other chest pain: Secondary | ICD-10-CM | POA: Diagnosis present

## 2024-07-29 DIAGNOSIS — E1165 Type 2 diabetes mellitus with hyperglycemia: Secondary | ICD-10-CM | POA: Insufficient documentation

## 2024-07-29 LAB — TROPONIN I (HIGH SENSITIVITY): Troponin I (High Sensitivity): <2 ng/L (ref <18)

## 2024-07-29 LAB — CBG MONITORING, ED: Glucose-Capillary: 251 mg/dL — ABNORMAL HIGH (ref 70–99)

## 2024-07-29 LAB — BASIC METABOLIC PANEL WITH GFR
Anion gap: 12 (ref 5–15)
BUN: 10 mg/dL (ref 6–20)
CO2: 24 mmol/L (ref 22–32)
Calcium: 8.9 mg/dL (ref 8.9–10.3)
Chloride: 100 mmol/L (ref 98–111)
Creatinine, Ser: 0.41 mg/dL — ABNORMAL LOW (ref 0.44–1.00)
GFR, Estimated: >60 mL/min (ref >60)
Glucose, Bld: 263 mg/dL — ABNORMAL HIGH (ref 70–99)
Potassium: 3.2 mmol/L — ABNORMAL LOW (ref 3.5–5.1)
Sodium: 136 mmol/L (ref 135–145)

## 2024-07-29 LAB — CBC
HCT: 38.2 % (ref 36.0–46.0)
Hemoglobin: 13.1 g/dL (ref 12.0–15.0)
MCH: 27.8 pg (ref 26.0–34.0)
MCHC: 34.3 g/dL (ref 30.0–36.0)
MCV: 81.1 fL (ref 80.0–100.0)
Platelets: 306 K/uL (ref 150–400)
RBC: 4.71 MIL/uL (ref 3.87–5.11)
RDW: 12.2 % (ref 11.5–15.5)
WBC: 7.7 K/uL (ref 4.0–10.5)
nRBC: 0 % (ref 0.0–0.2)

## 2024-07-29 NOTE — ED Triage Notes (Signed)
 Patient ambulatory to triage with complaints of chest pain that started this morning. She states it is more to the right side of her chest, endorses some mild shortness of breath. Patient states she is T2 diabetic, took her Metformin  but has had blood sugars at 200 PTA.

## 2024-07-30 ENCOUNTER — Emergency Department
Admission: EM | Admit: 2024-07-30 | Discharge: 2024-07-30 | Disposition: A | Discharge status: Home / Self Care | Attending: Emergency Medicine | Admitting: Emergency Medicine

## 2024-07-30 DIAGNOSIS — R079 Chest pain, unspecified: Secondary | ICD-10-CM

## 2024-07-30 DIAGNOSIS — R739 Hyperglycemia, unspecified: Secondary | ICD-10-CM

## 2024-07-30 NOTE — ED Provider Notes (Signed)
 [facility] Provider Note    Event Date/Time   First MD Initiated Contact with Patient 07/30/24 0209     (approximate)   History   Chest Pain   HPI  Jordan Maddox is a 38 y.o. female   Past medical history of diabetes who presents to Emergency Department with right-sided chest pain.  Started this morning while she was at work with no obvious inciting event, right sided chest pain with radiation down the right arm.  Spontaneous resolved.  Several discrete intermittent episodes throughout the day with no obvious inciting or alleviating factors.  Lasting seconds to minutes at a time.  Some shortness of breath associated.  No respiratory infectious symptoms.  No trauma.  Denies abdominal pain nausea or vomiting.  No GU symptoms.  No skin rash.  In between the episodes she feels completely fine.  She has no symptoms currently.  Independent Historian contributed to assessment above: Husband corroborates information given above  External Medical Documents Reviewed: Previous outpatient notes      Physical Exam   Triage Vital Signs: ED Triage Vitals [07/29/24 2252]  Encounter Vitals Group     BP (!) 149/91     Girls Systolic BP Percentile      Girls Diastolic BP Percentile      Boys Systolic BP Percentile      Boys Diastolic BP Percentile      Pulse Rate 69     Resp 18     Temp 97.8 F (36.6 C)     Temp Source Oral     SpO2 100 %     Weight 115 lb (52.2 kg)     Height 5' 1 (1.549 m)     Head Circumference      Peak Flow      Pain Score 8     Pain Loc      Pain Education      Exclude from Growth Chart     Most recent vital signs: Vitals:   07/29/24 2252 07/30/24 0230  BP: (!) 149/91 103/67  Pulse: 69 60  Resp: 18 15  Temp: 97.8 F (36.6 C)   SpO2: 100% 99%    General: Awake, no distress.  CV:  Good peripheral perfusion.  Resp:  Normal effort.  Abd:  No distention.  Other:  Awake alert comfortable slightly hypertensive  otherwise vital signs breathing comfortably on room air.  Clear lungs to auscultation in all lung fields, no heart murmurs.  Skin appears warm well-perfused.  Radial pulses intact and equal bilaterally.  No pain with ranging of the right upper extremity, no pain with palpation of the chest wall, no pain with palpation to all quadrants of the abdomen, soft and nondistended.   ED Results / Procedures / Treatments   Labs (all labs ordered are listed, but only abnormal results are displayed) Labs Reviewed  BASIC METABOLIC PANEL WITH GFR - Abnormal; Notable for the following components:      Result Value   Potassium 3.2 (*)    Glucose, Bld 263 (*)    Creatinine, Ser 0.41 (*)    All other components within normal limits  CBG MONITORING, ED - Abnormal; Notable for the following components:   Glucose-Capillary 251 (*)    All other components within normal limits  CBC  TROPONIN I (HIGH SENSITIVITY)     I ordered and reviewed the above labs they are notable for hyperglycemia with normal anion gap, otherwise cell counts electrolytes unremarkable.  Troponin negative.  EKG  ED ECG REPORT I, Ginnie Shams, the attending physician, personally viewed and interpreted this ECG.   Date: 07/30/2024  EKG Time: 2250  Rate: 69  Rhythm: sinus  Axis: nl  Intervals:nl  ST&T Change: no stemi    RADIOLOGY I independently reviewed and interpreted chest x-ray and I see no obvious focality pneumothorax I also reviewed radiologist's formal read.   PROCEDURES:  Critical Care performed: No  Procedures   MEDICATIONS ORDERED IN ED: Medications - No data to display   IMPRESSION / MDM / ASSESSMENT AND PLAN / ED COURSE  I reviewed the triage vital signs and the nursing notes.                                Patient's presentation is most consistent with acute presentation with potential threat to life or bodily function.  Differential diagnosis includes, but is not limited to, ACS, dissection,  PE, pneumothorax, respiratory infection, musculoskeletal pain   The patient is on the cardiac monitor to evaluate for evidence of arrhythmia and/or significant heart rate changes.  MDM:    Nonspecific chest pain that is completely resolved at this time.  Intermittent quality is unlikely to represent cardiopulmonary emergencies like ACS PE or dissection.    Considered gallbladder problems with referred pain but a very soft nontender abdominal exam negative Murphy signs makes this unlikely.  Defer second troponin given her onset of symptoms 9 AM and troponin tested approximately 12 hours later, low overall clinical suspicion of cardiac ischemia.   I considered hospitalization for admission or observation however given unremarkable workup and this stable and asymptomatic patient currently, I think she can be followed up as an outpatient with her PMD.        FINAL CLINICAL IMPRESSION(S) / ED DIAGNOSES   Final diagnoses:  Nonspecific chest pain  Hyperglycemia     Rx / DC Orders   ED Discharge Orders     None        Note:  This document was prepared using Dragon voice recognition software and may include unintentional dictation errors.    Shams Ginnie, MD 07/30/24 [phone]

## 2024-07-30 NOTE — Discharge Instructions (Addendum)
 Fortunately your evaluation in the Emergency Department did not find any emergency conditions like a heart attack to account for your pain tonight.  We considered other potential diagnoses like gallbladder problems but since the pain is not in the right upper part of your abdomen, this is unlikely.  If you do develop pain that starts to feel like it is in the right upper part of your belly, especially after you eat, talk to your doctor about getting an ultrasound of your gallbladder.  Take acetaminophen  650 mg and ibuprofen 400 mg every 6 hours for pain.  Take with food.   Thank you for choosing us  for your health care today!  Please see your primary doctor this week for a follow up appointment.   If you have any new, worsening, or unexpected symptoms call your doctor right away or come back to the emergency department for reevaluation.  It was my pleasure to care for you today.   Ginnie EDISON Cyrena, MD
